# Patient Record
Sex: Female | Born: 1995
Health system: Southern US, Community
[De-identification: ages and names within clinical notes are randomized; demographics above are authoritative.]

---
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
## 2017-02-01 DIAGNOSIS — J020 Streptococcal pharyngitis: Secondary | ICD-10-CM | POA: Diagnosis not present

## 2017-02-01 DIAGNOSIS — Z23 Encounter for immunization: Secondary | ICD-10-CM | POA: Diagnosis not present

## 2017-02-22 DIAGNOSIS — Z113 Encounter for screening for infections with a predominantly sexual mode of transmission: Secondary | ICD-10-CM | POA: Diagnosis not present

## 2017-02-22 DIAGNOSIS — Z0000 Encounter for general adult medical examination without abnormal findings: Secondary | ICD-10-CM | POA: Diagnosis not present

## 2017-02-22 DIAGNOSIS — Z68.41 Body mass index (BMI) pediatric, 5th percentile to less than 85th percentile for age: Secondary | ICD-10-CM | POA: Diagnosis not present

## 2017-02-22 DIAGNOSIS — Z23 Encounter for immunization: Secondary | ICD-10-CM | POA: Diagnosis not present

## 2017-06-09 IMAGING — CR DG CERVICAL SPINE COMPLETE 4+V
1 series · 5 of 5 positions shown · non-contrast
Comparison: None.

CLINICAL DATA: Someone fell from a stunt on pt during cheer.
Posterior neck pain going into left shoulder.

EXAM:
CERVICAL SPINE - COMPLETE 4+ VIEW

[Series 1: dg cervical spine complete · 0.14mm/px · 5 of 5 slices shown]
[im 1/5]
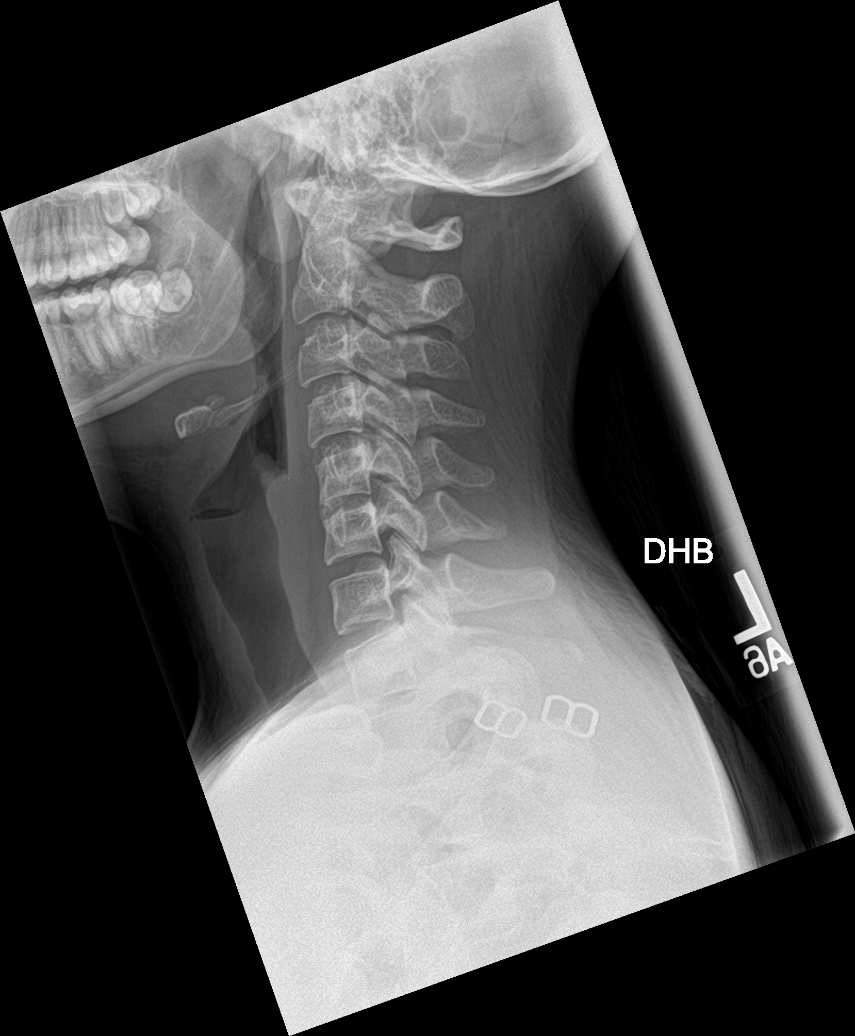
[im 2/5]
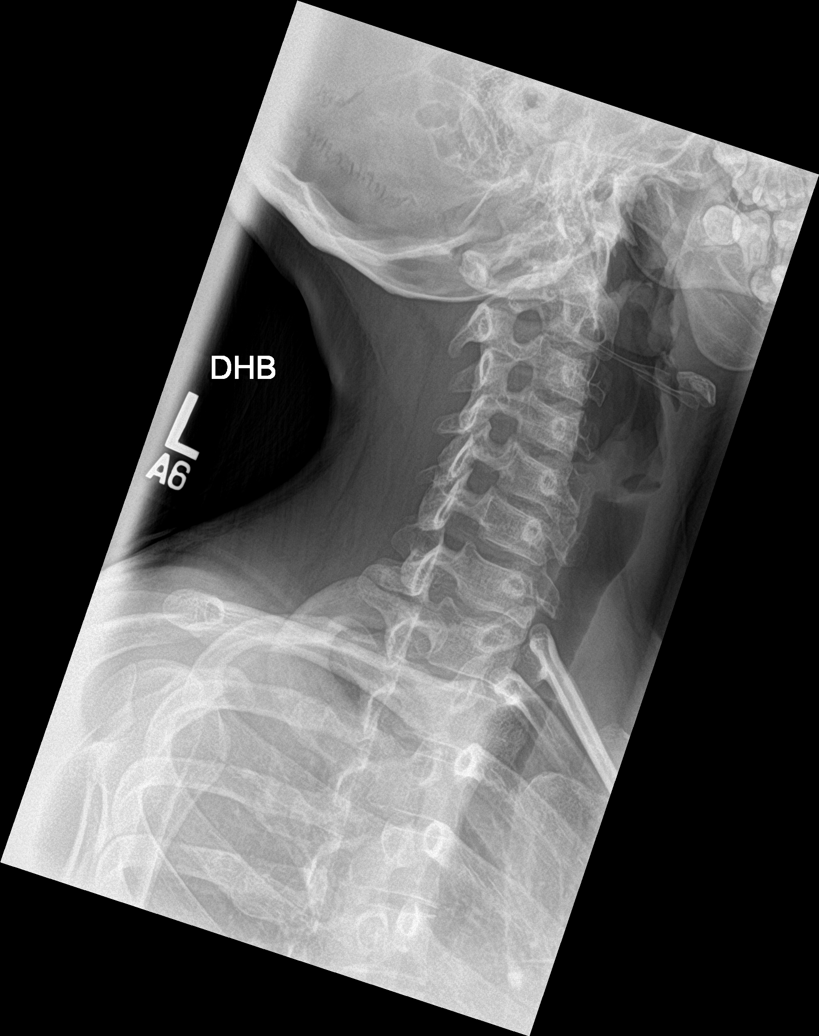
[im 3/5]
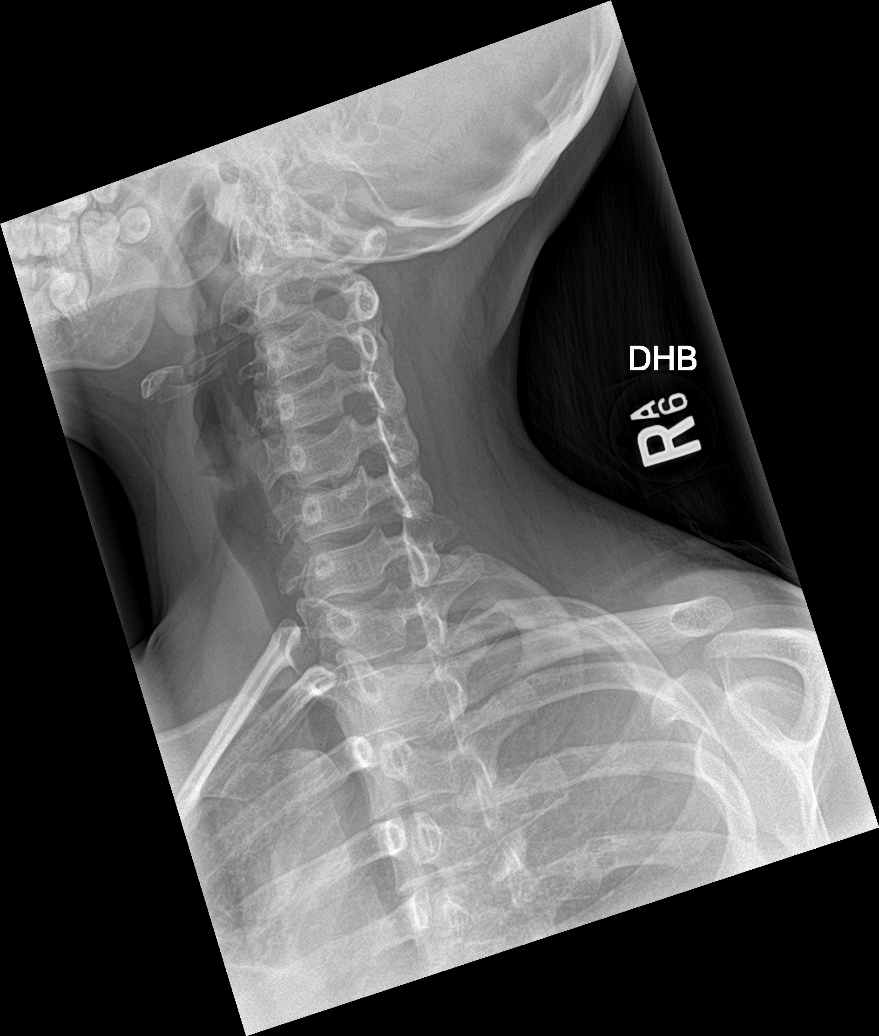
[im 4/5]
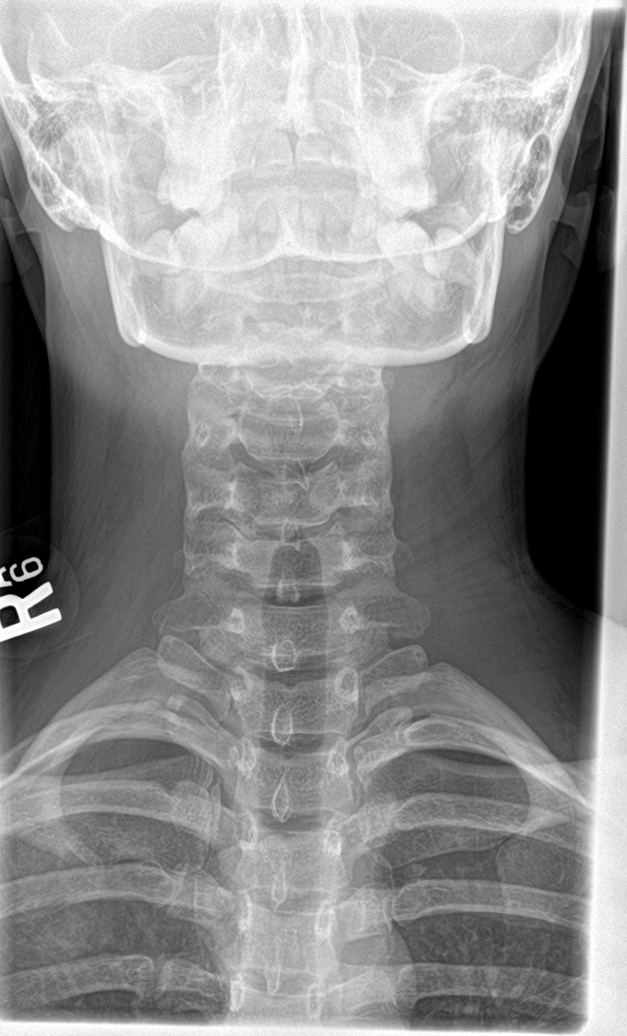
[im 5/5]
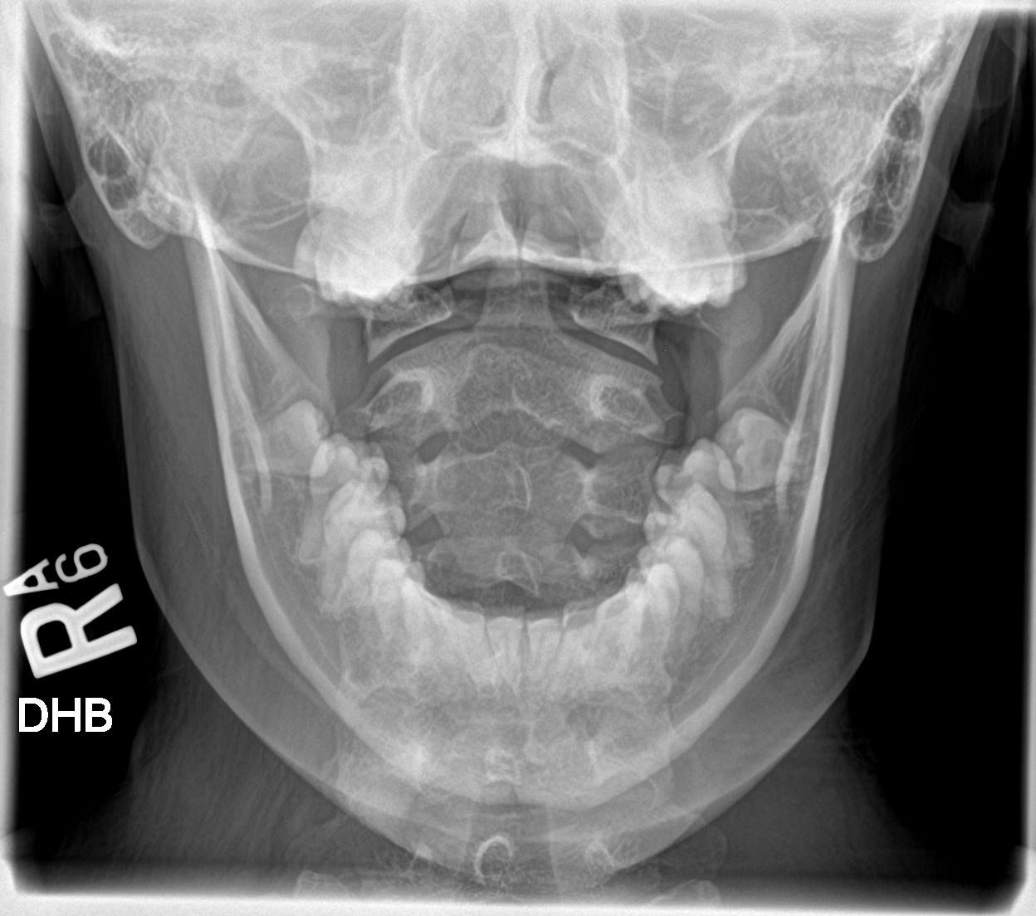

[5 of 5 positions shown; findings below may reference images not displayed]

FINDINGS: Five views of the cervical spine submitted. No acute fracture or
subluxation. Alignment, disc spaces and vertebral body heights are
preserved. C1-C2 relationship is unremarkable. No prevertebral soft
tissue swelling. Cervical airway is patent.
IMPRESSION: Negative cervical spine radiographs.

## 2017-07-13 ENCOUNTER — Encounter: Payer: Self-pay | Admitting: Emergency Medicine

## 2017-07-13 ENCOUNTER — Emergency Department
Admission: EM | Admit: 2017-07-13 | Discharge: 2017-07-13 | Disposition: A | Discharge status: Home / Self Care | Payer: BLUE CROSS/BLUE SHIELD | Attending: Emergency Medicine | Admitting: Emergency Medicine

## 2017-07-13 ENCOUNTER — Other Ambulatory Visit: Payer: Self-pay

## 2017-07-13 DIAGNOSIS — N751 Abscess of Bartholin's gland: Secondary | ICD-10-CM | POA: Diagnosis not present

## 2017-07-13 MED ORDER — TRAMADOL HCL 50 MG PO TABS
50.0000 mg | ORAL_TABLET | Freq: Once | ORAL | Status: AC
  Administered 2017-07-13: 50 mg via ORAL
  Filled 2017-07-13: qty 1

## 2017-07-13 MED ORDER — SULFAMETHOXAZOLE-TRIMETHOPRIM 800-160 MG PO TABS
1.0000 | ORAL_TABLET | Freq: Once | ORAL | Status: AC
  Administered 2017-07-13: 1 via ORAL
  Filled 2017-07-13: qty 1

## 2017-07-13 MED ORDER — TRAMADOL HCL 50 MG PO TABS: 50.0000 mg | ORAL_TABLET | Freq: Four times a day (QID) | ORAL | 0 refills | Status: AC | PRN

## 2017-07-13 MED ORDER — NAPROXEN 500 MG PO TABS: 500.0000 mg | ORAL_TABLET | Freq: Two times a day (BID) | ORAL | 0 refills | Status: AC

## 2017-07-13 MED ORDER — SULFAMETHOXAZOLE-TRIMETHOPRIM 800-160 MG PO TABS: 1.0000 | ORAL_TABLET | Freq: Two times a day (BID) | ORAL | 0 refills | Status: AC

## 2017-07-13 NOTE — ED Triage Notes (Signed)
Patient ambulatory to triage with steady gait, without difficulty or distress noted; pt reports right sided vag pain since last night with abscess noted; denies any abd pain; denies urinary c/o

## 2017-07-13 NOTE — ED Notes (Signed)
Patient states that she thinks she has an abscess on the opening of her vagina and it is tender and has been bleeding.

## 2017-07-13 NOTE — ED Provider Notes (Signed)
[facility] Emergency Department Provider Note  ____________________________________________  Time seen: Approximately 10:40 PM  I have reviewed the triage vital signs and the nursing notes.   HISTORY  Chief Complaint Abscess   HPI Kristina Ross is a 22 y.o. female who presents to the emergency department for treatment and evaluation of an abscess to the right labia that she noticed last night.  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  She has never had any abscess or skin infection.  She denies recent illness or STD exposure.  She has had no fever, nausea, or vomiting.  She has no history of diabetes.  History reviewed. No pertinent past medical history.  There are no active problems to display for this patient.   History reviewed. No pertinent surgical history.  Prior to Admission medications   Medication Sig Start Date End Date Taking? Authorizing Provider  cyclobenzaprine (FLEXERIL) 5 MG tablet Take 1 tablet (5 mg total) by mouth 2 (two) times daily as needed for muscle spasms. Can cause drowsiness. 04/29/16   Jolene Provost, MD  naproxen (NAPROSYN) 500 MG tablet Take 1 tablet (500 mg total) by mouth 2 (two) times daily with a meal. 07/13/17   Adelita Hone B, FNP  sulfamethoxazole-trimethoprim (BACTRIM DS,SEPTRA DS) 800-160 MG tablet Take 1 tablet by mouth 2 (two) times daily. 07/13/17   Thanh Mottern B, FNP  traMADol (ULTRAM) 50 MG tablet Take 1 tablet (50 mg total) by mouth every 6 (six) hours as needed. 07/13/17   Chinita Pester, FNP    Allergies Patient has no known allergies.  No family history on file.  Social History Social History   Tobacco Use  . Smoking status: Never Smoker  . Smokeless tobacco: Never Used  Substance Use Topics  . Alcohol use: No  . Drug use: Not on file    Review of Systems  Constitutional: Negative for  fever. Respiratory: Negative for cough or shortness of breath.  Musculoskeletal: Negative for myalgias Skin: Positive for labial abscess. Neurological: Negative for numbness or paresthesias. ____________________________________________   PHYSICAL EXAM:  VITAL SIGNS: ED Triage Vitals [07/13/17 2150]  Enc Vitals Group     BP 139/86     Pulse Rate 71     Resp 18     Temp 98.4 F (36.9 C)     Temp Source Oral     SpO2 97 %     Weight 148 lb (67.1 kg)     Height  (1.626 m)     Head Circumference      Peak Flow      Pain Score 6     Pain Loc      Pain Edu?      Excl. in GC?      Constitutional: Well appearing. Eyes: Conjunctivae are clear without discharge or drainage. Nose: No rhinorrhea noted. Mouth/Throat: Airway is patent.  Neck: No stridor. Unrestricted range of motion observed. Lymphatic: No inguinal lymphadenopathy.  Cardiovascular: Capillary refill is <3 seconds.  Respiratory: Respirations are even and unlabored.. Musculoskeletal: Unrestricted range of motion observed. Neurologic: Awake, alert, and oriented x 4.  Skin: Draining right labial abscess.  ____________________________________________   LABS (all labs ordered are listed, but only abnormal results are displayed)  Labs Reviewed - No data to display ____________________________________________  EKG  Not indicated. ____________________________________________  RADIOLOGY  Not indicated. ____________________________________________   PROCEDURES  Procedures ____________________________________________  INITIAL IMPRESSION / ASSESSMENT AND PLAN / ED COURSE  Kristina Ross is a 22 y.o. female who presents to the emergency department for evaluation and treatment of an abscess to the right labia.  Abscess is open and draining serosanguineous and purulent fluid.  Area is fluctuant without induration.  She will be treated with Bactrim and given tramadol and Naprosyn for pain control.   Patient was advised to avoid cycling and swimming until the abscess is completely cleared.  She is to schedule follow-up appointment with gynecology if symptoms are not improving over the next 2 to 3 days or if the abscess has not completely resolved within the next 2 weeks.  She was advised to return to the emergency department for fever, nausea, vomiting or other symptoms of concern if she is unable to schedule an appointment with gynecology.  Medications  sulfamethoxazole-trimethoprim (BACTRIM DS,SEPTRA DS) 800-160 MG per tablet 1 tablet (1 tablet Oral Given 07/13/17 2301)  traMADol (ULTRAM) tablet 50 mg (50 mg Oral Given 07/13/17 2301)     Pertinent labs & imaging results that were available during my care of the patient were reviewed by me and considered in my medical decision making (see chart for details). ____________________________________________   FINAL CLINICAL IMPRESSION(S) / ED DIAGNOSES  Final diagnoses:  Bartholin's gland abscess    ED Discharge Orders        Ordered    sulfamethoxazole-trimethoprim (BACTRIM DS,SEPTRA DS) 800-160 MG tablet  2 times daily     07/13/17 2252    traMADol (ULTRAM) 50 MG tablet  Every 6 hours PRN     07/13/17 2252    naproxen (NAPROSYN) 500 MG tablet  2 times daily with meals     07/13/17 2258       Note:  This document was prepared using Dragon voice recognition software and may include unintentional dictation errors.    Chinita Pester, FNP 07/13/17 4098    Dionne Bucy, MD 07/13/17 2313

## 2017-09-22 DIAGNOSIS — M25551 Pain in right hip: Secondary | ICD-10-CM | POA: Diagnosis not present

## 2017-10-19 ENCOUNTER — Ambulatory Visit (INDEPENDENT_AMBULATORY_CARE_PROVIDER_SITE_OTHER): Payer: BLUE CROSS/BLUE SHIELD | Admitting: Family Medicine

## 2017-10-19 ENCOUNTER — Ambulatory Visit
Admission: RE | Admit: 2017-10-19 | Discharge: 2017-10-19 | Disposition: A | Discharge status: Home / Self Care | Payer: BLUE CROSS/BLUE SHIELD | Source: Ambulatory Visit | Attending: Family Medicine | Admitting: Family Medicine

## 2017-10-19 ENCOUNTER — Encounter: Payer: Self-pay | Admitting: Family Medicine

## 2017-10-19 VITALS — BP 115/54 | HR 70 | Temp 98.3°F | Resp 14

## 2017-10-19 DIAGNOSIS — S060X0A Concussion without loss of consciousness, initial encounter: Secondary | ICD-10-CM

## 2017-10-19 DIAGNOSIS — S0992XA Unspecified injury of nose, initial encounter: Secondary | ICD-10-CM

## 2017-10-19 DIAGNOSIS — X58XXXA Exposure to other specified factors, initial encounter: Secondary | ICD-10-CM | POA: Insufficient documentation

## 2017-10-19 DIAGNOSIS — S022XXA Fracture of nasal bones, initial encounter for closed fracture: Secondary | ICD-10-CM | POA: Diagnosis not present

## 2017-10-19 NOTE — Addendum Note (Signed)
Addended by: Dione HousekeeperPATEL, Maynor Mwangi N on: 10/19/2017 11:53 AM   Modules accepted: Orders
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
## 2017-10-24 DIAGNOSIS — S022XXA Fracture of nasal bones, initial encounter for closed fracture: Secondary | ICD-10-CM | POA: Diagnosis not present

## 2017-11-14 DIAGNOSIS — J029 Acute pharyngitis, unspecified: Secondary | ICD-10-CM | POA: Diagnosis not present

## 2017-11-14 DIAGNOSIS — J309 Allergic rhinitis, unspecified: Secondary | ICD-10-CM | POA: Diagnosis not present

## 2018-02-09 ENCOUNTER — Emergency Department: Payer: BLUE CROSS/BLUE SHIELD

## 2018-02-09 ENCOUNTER — Encounter: Payer: Self-pay | Admitting: Family Medicine

## 2018-02-09 ENCOUNTER — Ambulatory Visit (INDEPENDENT_AMBULATORY_CARE_PROVIDER_SITE_OTHER): Payer: BLUE CROSS/BLUE SHIELD | Admitting: Family Medicine

## 2018-02-09 ENCOUNTER — Emergency Department
Admission: EM | Admit: 2018-02-09 | Discharge: 2018-02-09 | Disposition: A | Discharge status: Home / Self Care | Payer: BLUE CROSS/BLUE SHIELD | Attending: Emergency Medicine | Admitting: Emergency Medicine

## 2018-02-09 VITALS — BP 130/67 | HR 68 | Temp 98.4°F | Resp 14

## 2018-02-09 DIAGNOSIS — Y929 Unspecified place or not applicable: Secondary | ICD-10-CM | POA: Diagnosis not present

## 2018-02-09 DIAGNOSIS — R079 Chest pain, unspecified: Secondary | ICD-10-CM | POA: Diagnosis not present

## 2018-02-09 DIAGNOSIS — Y9345 Activity, cheerleading: Secondary | ICD-10-CM | POA: Insufficient documentation

## 2018-02-09 DIAGNOSIS — Z3202 Encounter for pregnancy test, result negative: Secondary | ICD-10-CM | POA: Insufficient documentation

## 2018-02-09 DIAGNOSIS — Y998 Other external cause status: Secondary | ICD-10-CM | POA: Insufficient documentation

## 2018-02-09 DIAGNOSIS — R109 Unspecified abdominal pain: Secondary | ICD-10-CM

## 2018-02-09 DIAGNOSIS — R110 Nausea: Secondary | ICD-10-CM | POA: Diagnosis not present

## 2018-02-09 DIAGNOSIS — R1010 Upper abdominal pain, unspecified: Secondary | ICD-10-CM

## 2018-02-09 DIAGNOSIS — W500XXA Accidental hit or strike by another person, initial encounter: Secondary | ICD-10-CM | POA: Insufficient documentation

## 2018-02-09 LAB — CBC WITH DIFFERENTIAL/PLATELET
Abs Immature Granulocytes: 0.02 10*3/uL (ref 0.00–0.07)
BASOS PCT: 1 %
Basophils Absolute: 0 10*3/uL (ref 0.0–0.1)
EOS ABS: 0.1 10*3/uL (ref 0.0–0.5)
Eosinophils Relative: 2 %
HEMATOCRIT: 40 % (ref 36.0–46.0)
Hemoglobin: 13.1 g/dL (ref 12.0–15.0)
Immature Granulocytes: 0 %
LYMPHS ABS: 2.1 10*3/uL (ref 0.7–4.0)
Lymphocytes Relative: 35 %
MCH: 28.9 pg (ref 26.0–34.0)
MCHC: 32.8 g/dL (ref 30.0–36.0)
MCV: 88.1 fL (ref 80.0–100.0)
MONO ABS: 0.5 10*3/uL (ref 0.1–1.0)
MONOS PCT: 8 %
Neutro Abs: 3.2 10*3/uL (ref 1.7–7.7)
Neutrophils Relative %: 54 %
PLATELETS: 249 10*3/uL (ref 150–400)
RBC: 4.54 MIL/uL (ref 3.87–5.11)
RDW: 12.7 % (ref 11.5–15.5)
WBC: 5.9 10*3/uL (ref 4.0–10.5)
nRBC: 0 % (ref 0.0–0.2)

## 2018-02-09 LAB — COMPREHENSIVE METABOLIC PANEL
ALT: 34 U/L (ref 0–44)
AST: 116 U/L — ABNORMAL HIGH (ref 15–41)
Albumin: 4.5 g/dL (ref 3.5–5.0)
Alkaline Phosphatase: 49 U/L (ref 38–126)
Anion gap: 8 (ref 5–15)
BILIRUBIN TOTAL: 0.7 mg/dL (ref 0.3–1.2)
BUN: 11 mg/dL (ref 6–20)
CHLORIDE: 104 mmol/L (ref 98–111)
CO2: 26 mmol/L (ref 22–32)
CREATININE: 0.76 mg/dL (ref 0.44–1.00)
Calcium: 9.1 mg/dL (ref 8.9–10.3)
Glucose, Bld: 80 mg/dL (ref 70–99)
POTASSIUM: 3.8 mmol/L (ref 3.5–5.1)
Sodium: 138 mmol/L (ref 135–145)
TOTAL PROTEIN: 7.8 g/dL (ref 6.5–8.1)

## 2018-02-09 LAB — URINALYSIS, COMPLETE (UACMP) WITH MICROSCOPIC
BILIRUBIN URINE: NEGATIVE
Bacteria, UA: NONE SEEN
GLUCOSE, UA: NEGATIVE mg/dL
HGB URINE DIPSTICK: NEGATIVE
KETONES UR: NEGATIVE mg/dL
LEUKOCYTES UA: NEGATIVE
NITRITE: NEGATIVE
PROTEIN: NEGATIVE mg/dL
Specific Gravity, Urine: 1.018 (ref 1.005–1.030)
pH: 7 (ref 5.0–8.0)

## 2018-02-09 LAB — POCT PREGNANCY, URINE: Preg Test, Ur: NEGATIVE

## 2018-02-09 LAB — LIPASE, BLOOD: LIPASE: 37 U/L (ref 11–51)

## 2018-02-09 MED ORDER — KETOROLAC TROMETHAMINE 30 MG/ML IJ SOLN
30.0000 mg | Freq: Once | INTRAMUSCULAR | Status: AC
  Administered 2018-02-09: 30 mg via INTRAVENOUS
  Filled 2018-02-09: qty 1

## 2018-02-09 MED ORDER — CYCLOBENZAPRINE HCL 5 MG PO TABS: 5.0000 mg | ORAL_TABLET | Freq: Three times a day (TID) | ORAL | 0 refills | Status: AC | PRN
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
## 2018-02-09 NOTE — Discharge Instructions (Addendum)
Please seek medical attention for any high fevers, chest pain, shortness of breath, change in behavior, persistent vomiting, bloody stool or any other new or concerning symptoms.  

## 2018-07-13 DIAGNOSIS — Z3009 Encounter for other general counseling and advice on contraception: Secondary | ICD-10-CM | POA: Diagnosis not present

## 2018-07-13 DIAGNOSIS — Z3046 Encounter for surveillance of implantable subdermal contraceptive: Secondary | ICD-10-CM | POA: Diagnosis not present

## 2018-11-08 DIAGNOSIS — N751 Abscess of Bartholin's gland: Secondary | ICD-10-CM | POA: Diagnosis not present

## 2018-12-01 IMAGING — CR DG NASAL BONES 3+V
1 series · 3 of 3 positions shown · non-contrast
Comparison: None.

CLINICAL DATA: Pt states she was lifting another cheerleader and
she fell and the hit faces. Pain and swelling nose but mainly on the
right side.

EXAM:
NASAL BONES - 3+ VIEW

[Series 1: dg nasal bones · 0.14mm/px · 3 of 3 slices shown]
[im 1/3]
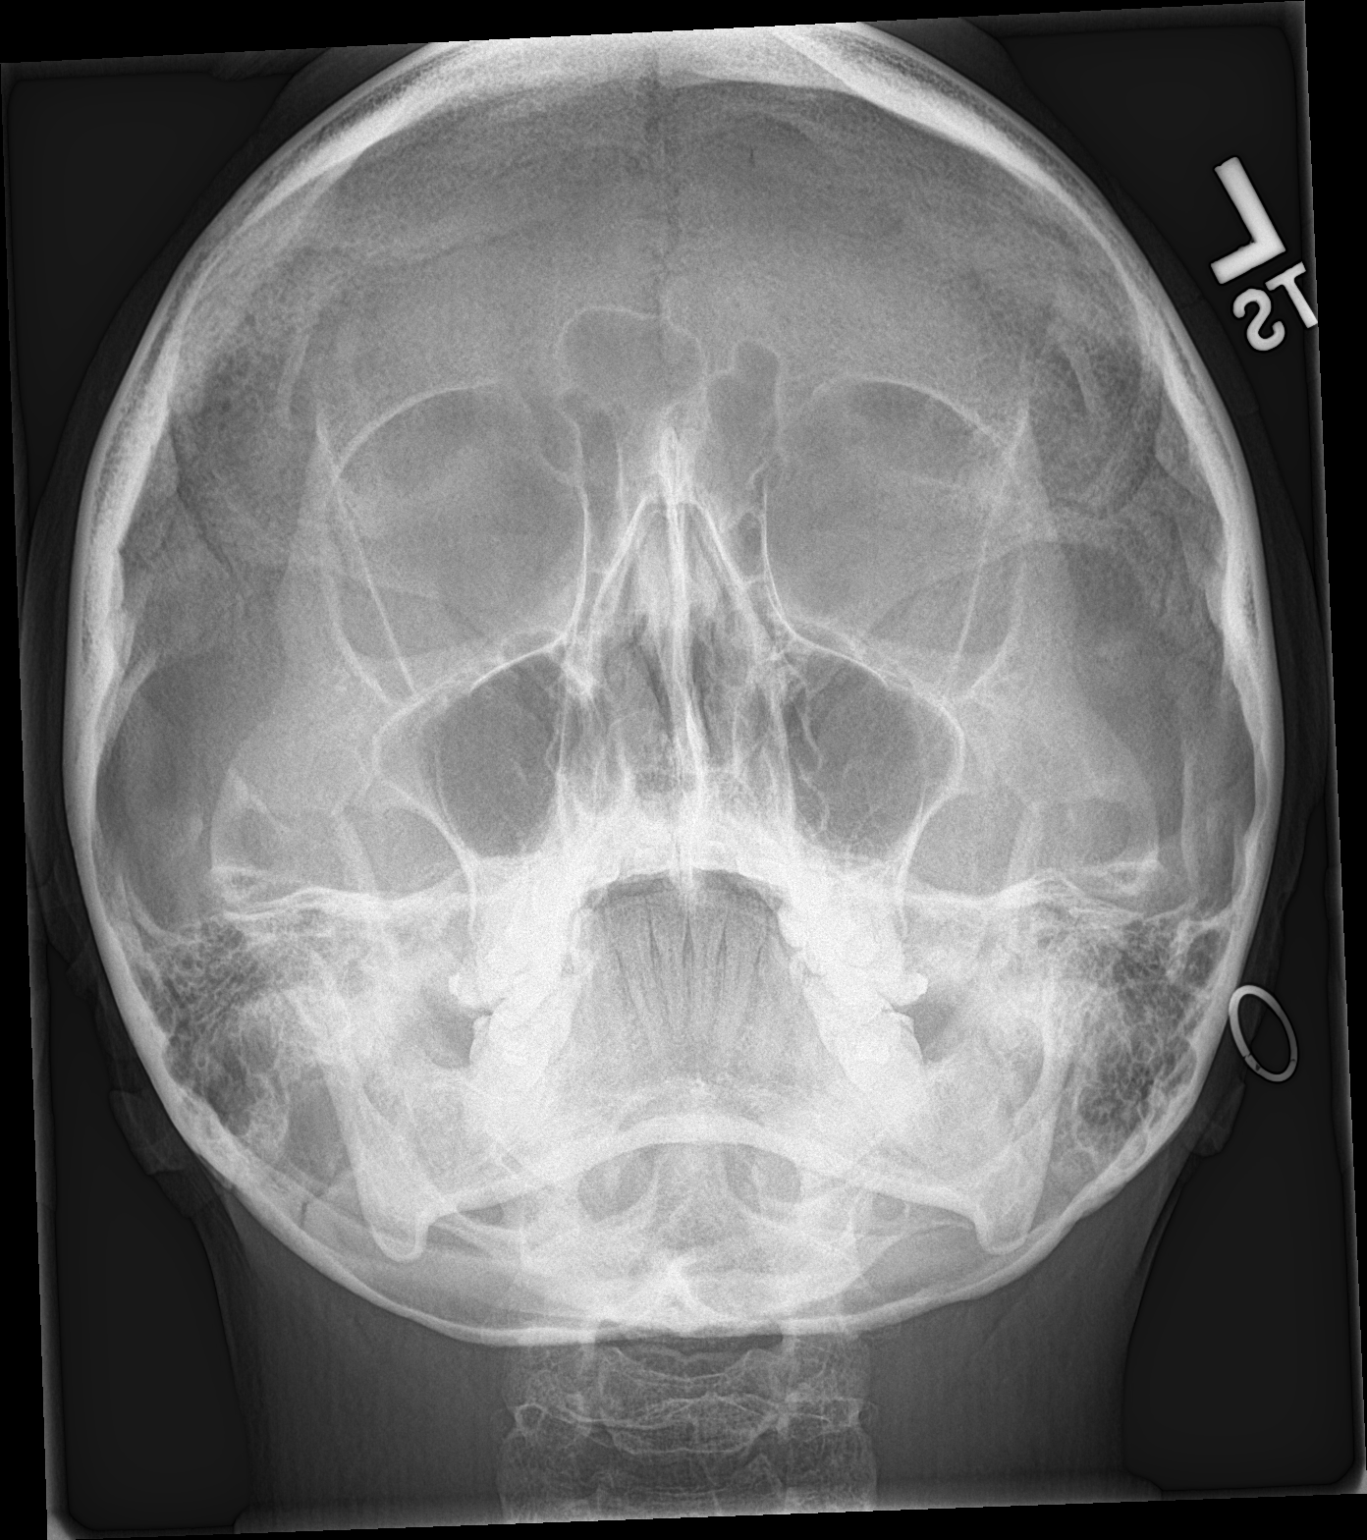
[im 2/3]
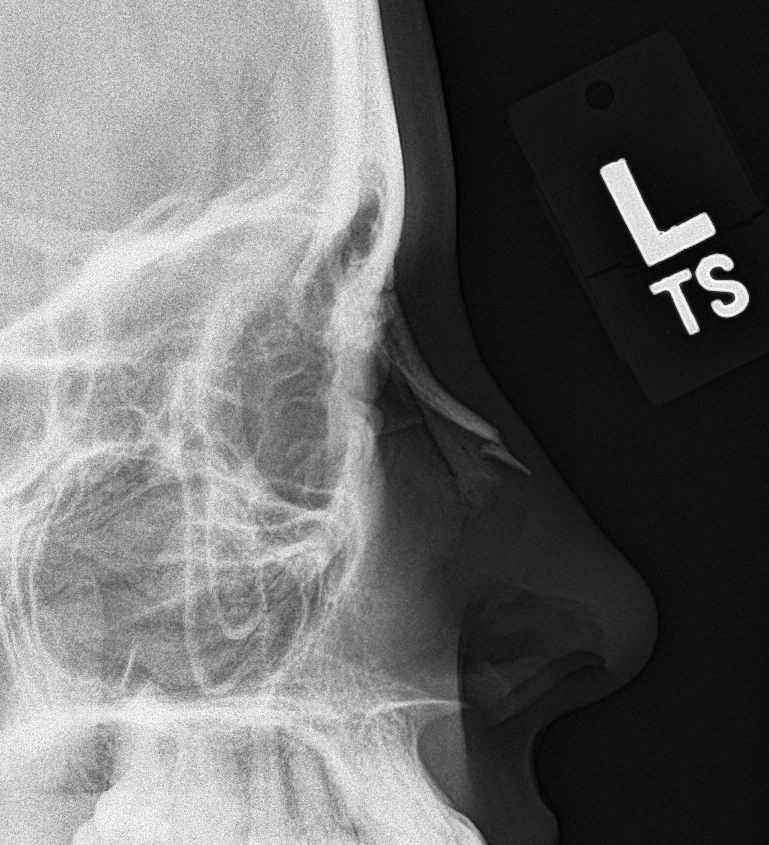
[im 3/3]
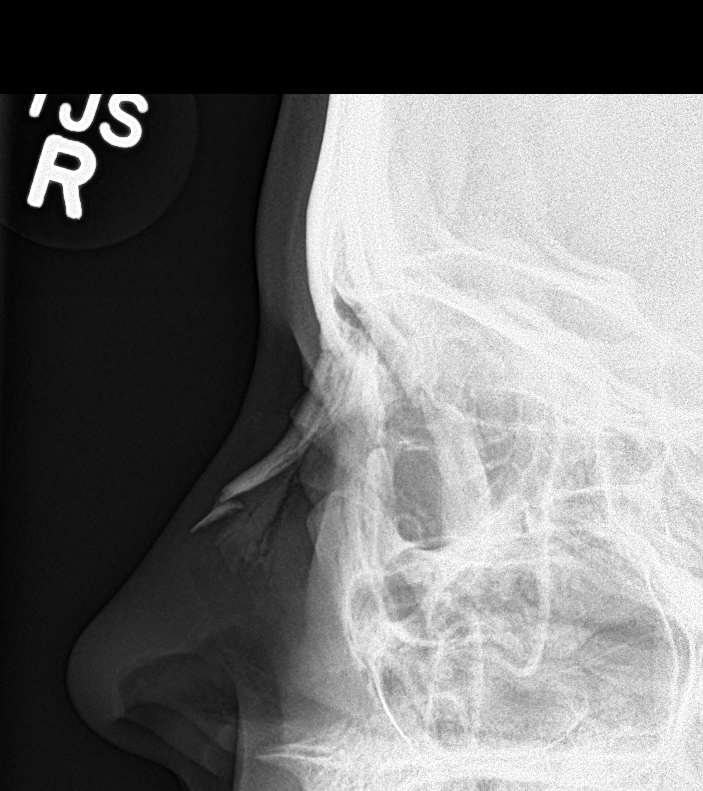

[3 of 3 positions shown; findings below may reference images not displayed]

FINDINGS: Depressed fracture of the distal aspect of the nasal bone with
approximately 2 mm of depression. No other fracture or subluxation.
IMPRESSION: Depressed fracture of the distal aspect of the nasal bone with
approximately 2 mm of depression.

## 2019-03-16 DIAGNOSIS — Z23 Encounter for immunization: Secondary | ICD-10-CM | POA: Diagnosis not present

## 2019-03-24 IMAGING — CR DG CHEST 2V
2 series · 2 of 2 positions shown · non-contrast
Comparison: None.

CLINICAL DATA: Chest pain since post injury.

EXAM:
CHEST - 2 VIEW

[chest pa]
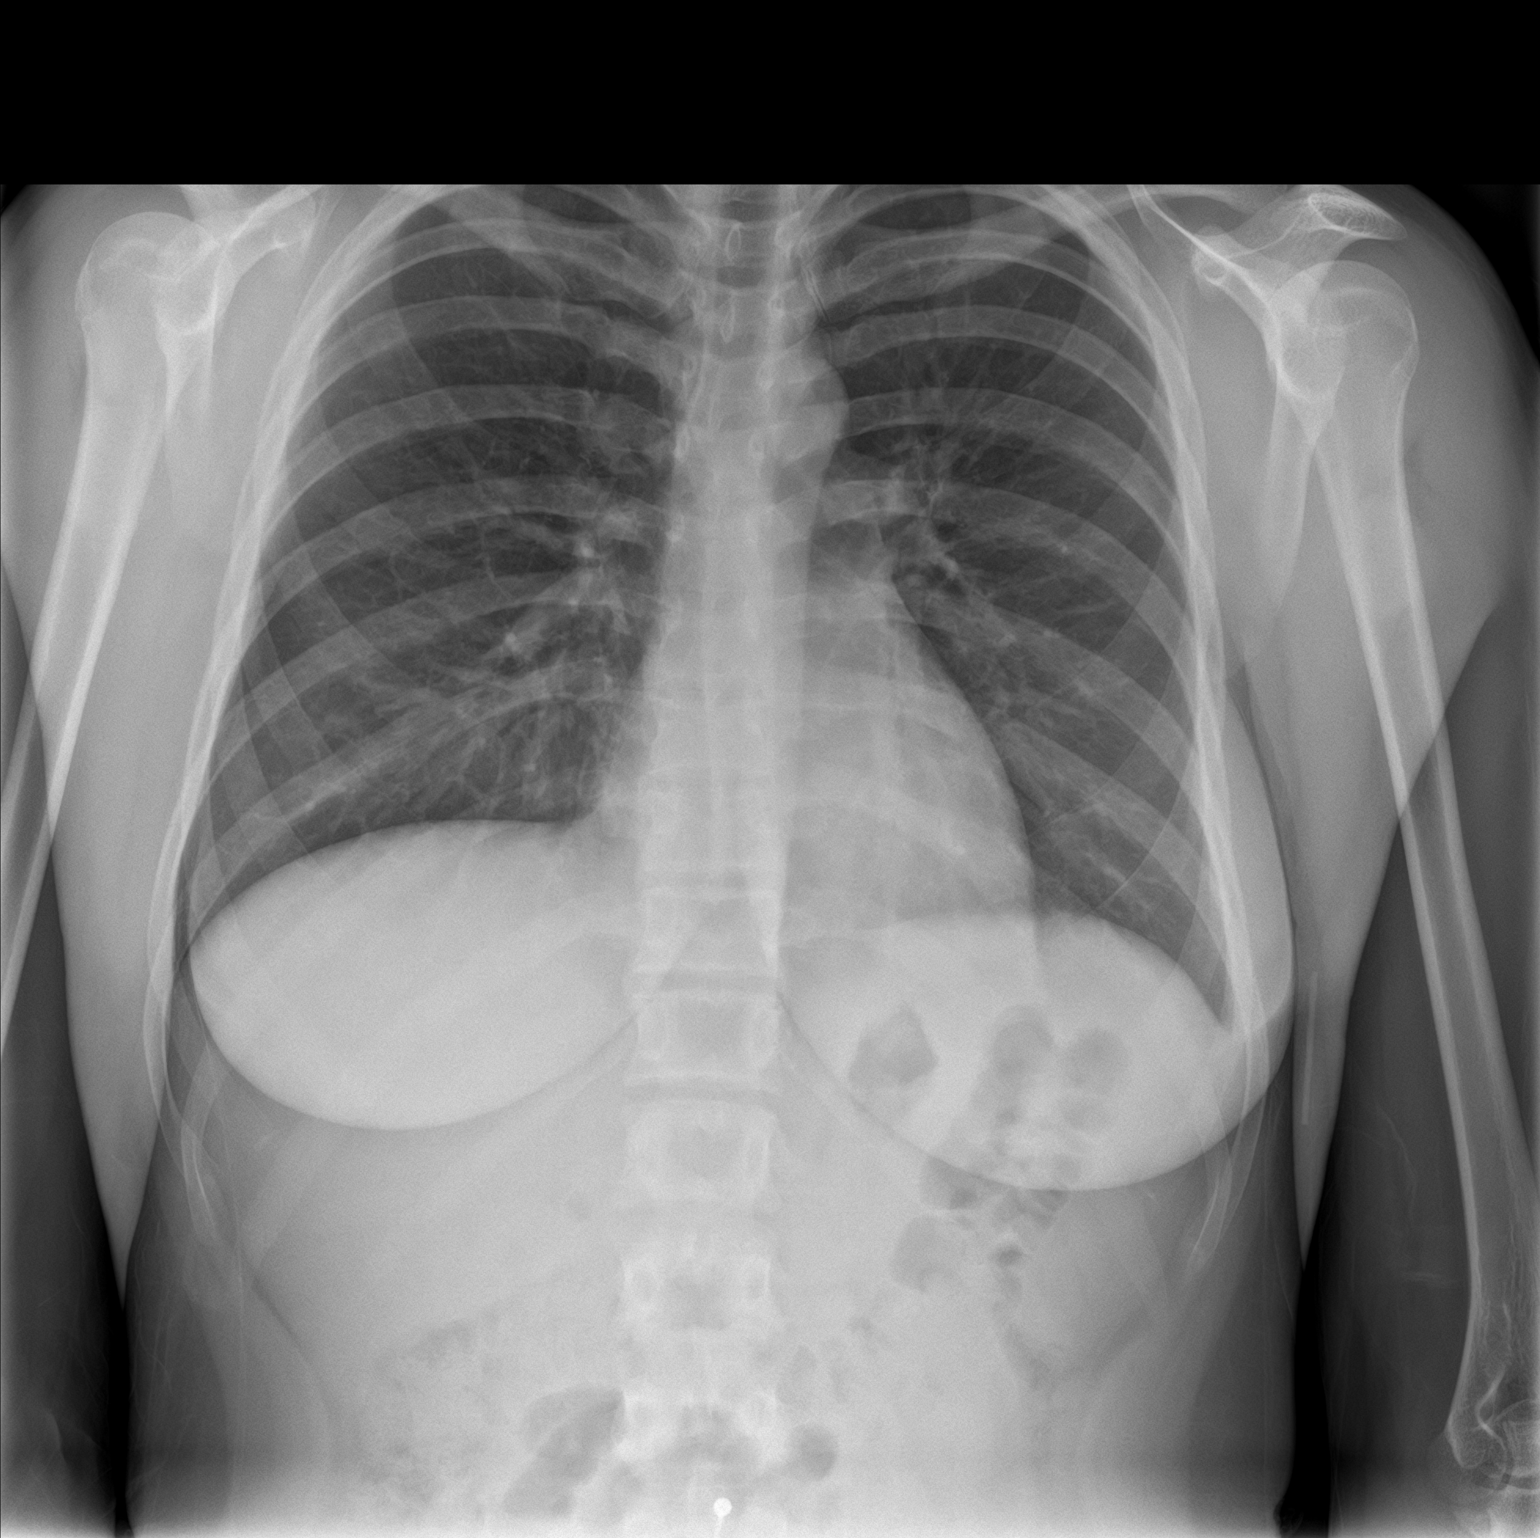

[chest lat]
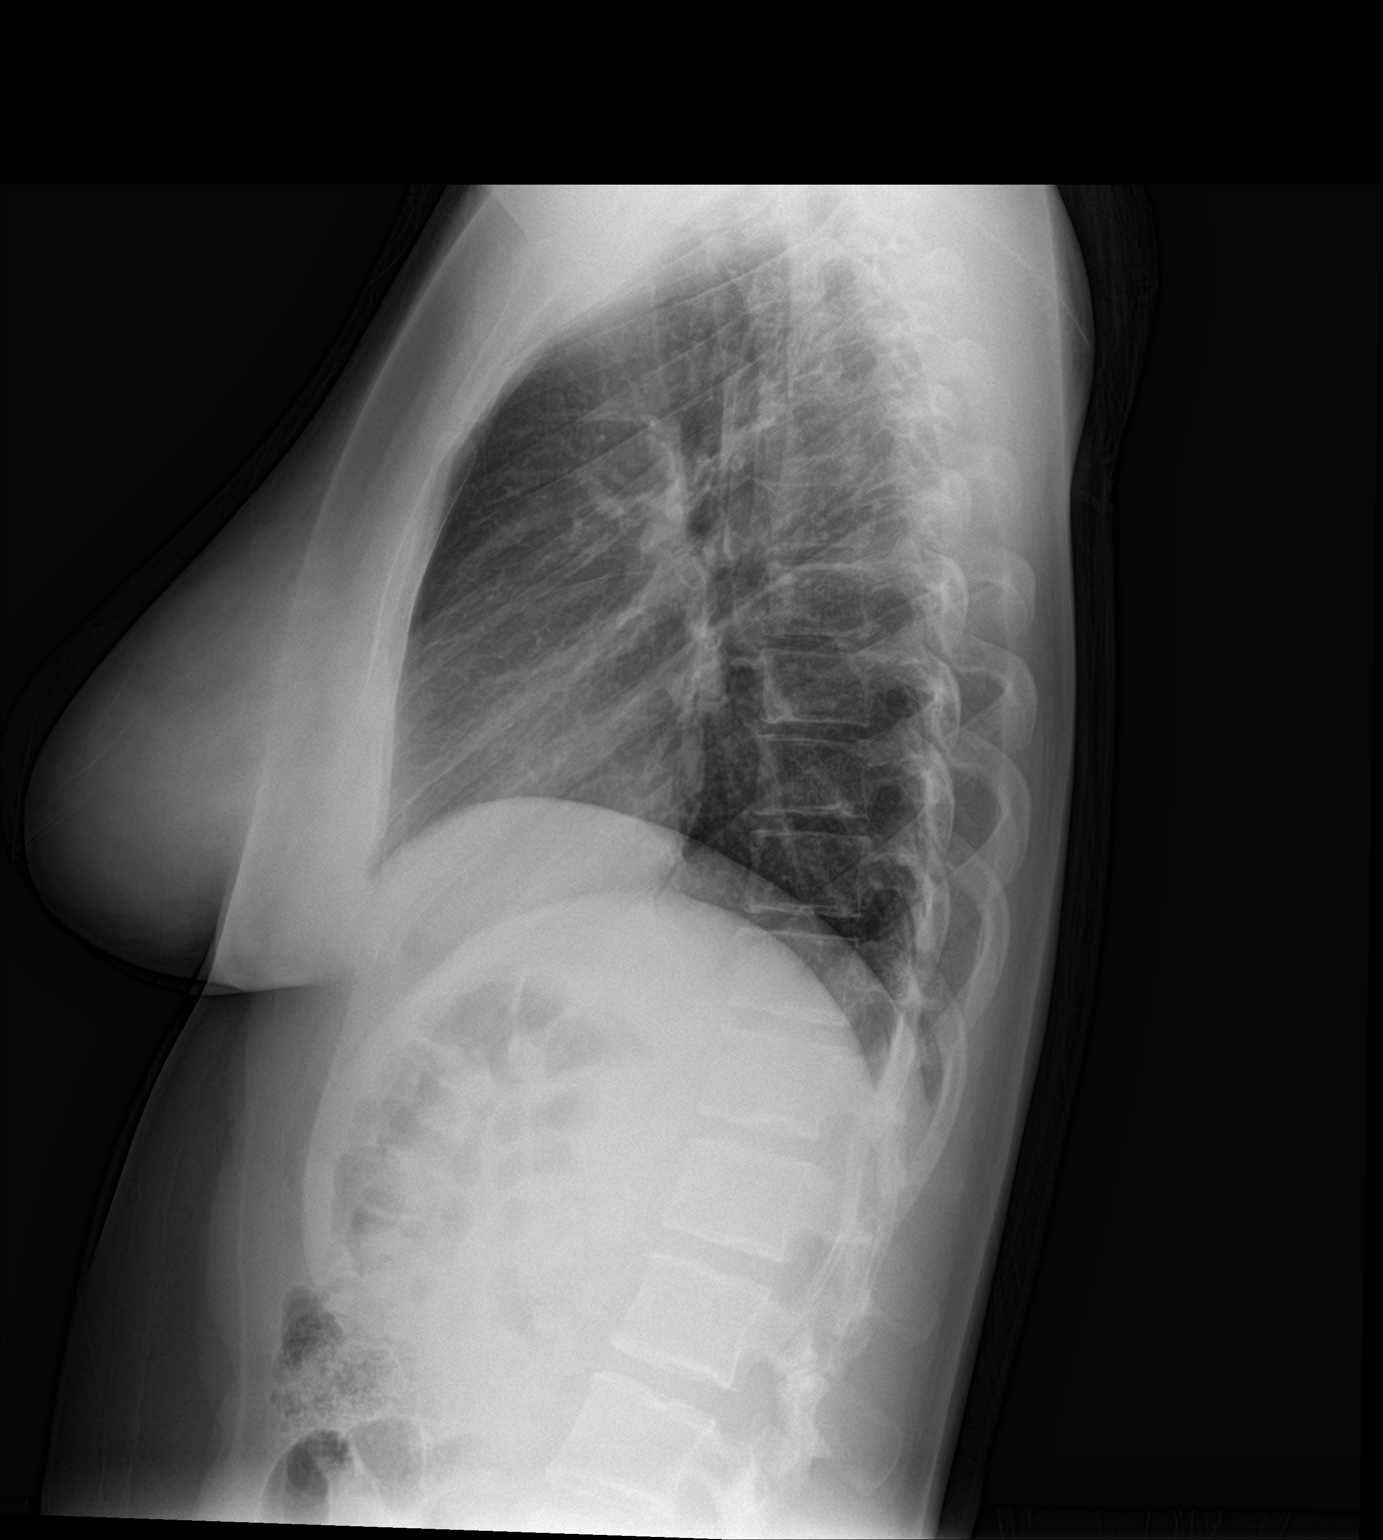

[2 of 2 positions shown; findings below may reference images not displayed]

FINDINGS: The heart size and mediastinal contours are within normal limits.
Both lungs are clear. The visualized skeletal structures are
unremarkable.
IMPRESSION: No active cardiopulmonary disease.

## 2019-05-20 DIAGNOSIS — Z8489 Family history of other specified conditions: Secondary | ICD-10-CM | POA: Diagnosis not present

## 2019-05-20 DIAGNOSIS — G933 Postviral fatigue syndrome: Secondary | ICD-10-CM | POA: Diagnosis not present

## 2019-05-28 DIAGNOSIS — J029 Acute pharyngitis, unspecified: Secondary | ICD-10-CM | POA: Diagnosis not present

## 2019-08-13 DIAGNOSIS — Z01419 Encounter for gynecological examination (general) (routine) without abnormal findings: Secondary | ICD-10-CM | POA: Diagnosis not present

## 2019-08-13 DIAGNOSIS — Z113 Encounter for screening for infections with a predominantly sexual mode of transmission: Secondary | ICD-10-CM | POA: Diagnosis not present

## 2019-08-13 DIAGNOSIS — Z124 Encounter for screening for malignant neoplasm of cervix: Secondary | ICD-10-CM | POA: Diagnosis not present

## 2019-09-25 DIAGNOSIS — Z20822 Contact with and (suspected) exposure to covid-19: Secondary | ICD-10-CM | POA: Diagnosis not present
